# Patient Record
Sex: Female | Born: 1970 | Race: Black or African American | Hispanic: No | Marital: Single | Smoking: Current every day smoker
Health system: Southern US, Community
[De-identification: ages and names within clinical notes are randomized; demographics above are authoritative.]

## PROBLEM LIST (undated history)

## (undated) ENCOUNTER — Ambulatory Visit (HOSPITAL_COMMUNITY): Disposition: A | Discharge status: Home / Self Care | Payer: Self-pay

## (undated) ENCOUNTER — Emergency Department (HOSPITAL_COMMUNITY): Payer: Self-pay

## (undated) DIAGNOSIS — M7989 Other specified soft tissue disorders: Secondary | ICD-10-CM

## (undated) DIAGNOSIS — I270 Primary pulmonary hypertension: Secondary | ICD-10-CM

## (undated) DIAGNOSIS — G4730 Sleep apnea, unspecified: Secondary | ICD-10-CM

## (undated) DIAGNOSIS — J40 Bronchitis, not specified as acute or chronic: Secondary | ICD-10-CM

## (undated) DIAGNOSIS — J309 Allergic rhinitis, unspecified: Secondary | ICD-10-CM

## (undated) DIAGNOSIS — D649 Anemia, unspecified: Secondary | ICD-10-CM

## (undated) DIAGNOSIS — N63 Unspecified lump in unspecified breast: Secondary | ICD-10-CM

## (undated) DIAGNOSIS — L309 Dermatitis, unspecified: Secondary | ICD-10-CM

## (undated) DIAGNOSIS — E611 Iron deficiency: Secondary | ICD-10-CM

## (undated) DIAGNOSIS — R011 Cardiac murmur, unspecified: Secondary | ICD-10-CM

## (undated) DIAGNOSIS — F1010 Alcohol abuse, uncomplicated: Secondary | ICD-10-CM

## (undated) DIAGNOSIS — I10 Essential (primary) hypertension: Secondary | ICD-10-CM

## (undated) DIAGNOSIS — I509 Heart failure, unspecified: Secondary | ICD-10-CM

## (undated) DIAGNOSIS — E559 Vitamin D deficiency, unspecified: Secondary | ICD-10-CM

## (undated) DIAGNOSIS — E785 Hyperlipidemia, unspecified: Secondary | ICD-10-CM

## (undated) DIAGNOSIS — J45909 Unspecified asthma, uncomplicated: Secondary | ICD-10-CM

## (undated) HISTORY — DX: Iron deficiency: E61.1

## (undated) HISTORY — DX: Anemia, unspecified: D64.9

## (undated) HISTORY — DX: Heart failure, unspecified: I50.9

## (undated) HISTORY — DX: Vitamin D deficiency, unspecified: E55.9

## (undated) HISTORY — DX: Unspecified lump in unspecified breast: N63.0

## (undated) HISTORY — DX: Other specified soft tissue disorders: M79.89

## (undated) HISTORY — DX: Hyperlipidemia, unspecified: E78.5

## (undated) HISTORY — DX: Dermatitis, unspecified: L30.9

## (undated) HISTORY — DX: Unspecified asthma, uncomplicated: J45.909

## (undated) HISTORY — DX: Sleep apnea, unspecified: G47.30

## (undated) HISTORY — DX: Alcohol abuse, uncomplicated: F10.10

## (undated) HISTORY — DX: Cardiac murmur, unspecified: R01.1

## (undated) HISTORY — DX: Bronchitis, not specified as acute or chronic: J40

## (undated) HISTORY — DX: Primary pulmonary hypertension: I27.0

## (undated) HISTORY — DX: Allergic rhinitis, unspecified: J30.9

---

## 2007-12-12 ENCOUNTER — Emergency Department (HOSPITAL_COMMUNITY): Admission: EM | Admit: 2007-12-12 | Discharge: 2007-12-12 | Payer: Self-pay | Admitting: Emergency Medicine

## 2009-10-19 IMAGING — CR DG FOOT COMPLETE 3+V*R*
3 series · 3 of 3 positions shown · non-contrast
Comparison: None

CLINICAL DATA: Twisting injury to the right foot with lateral pain.

RIGHT FOOT COMPLETE - 3+ VIEW

[view not recorded (1 of 3)]
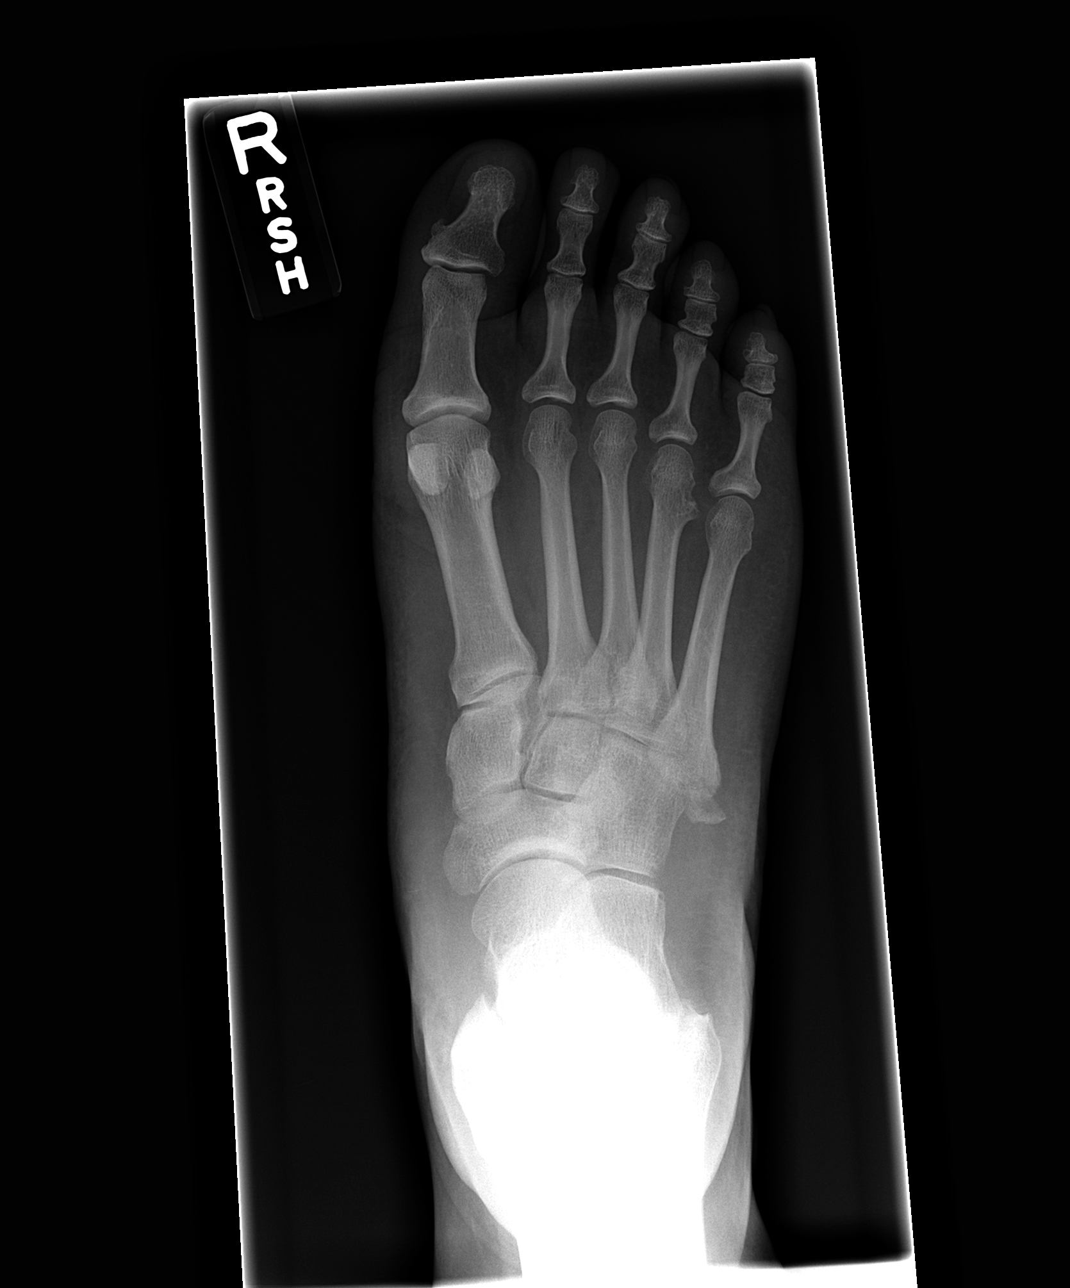

[view not recorded (2 of 3)]
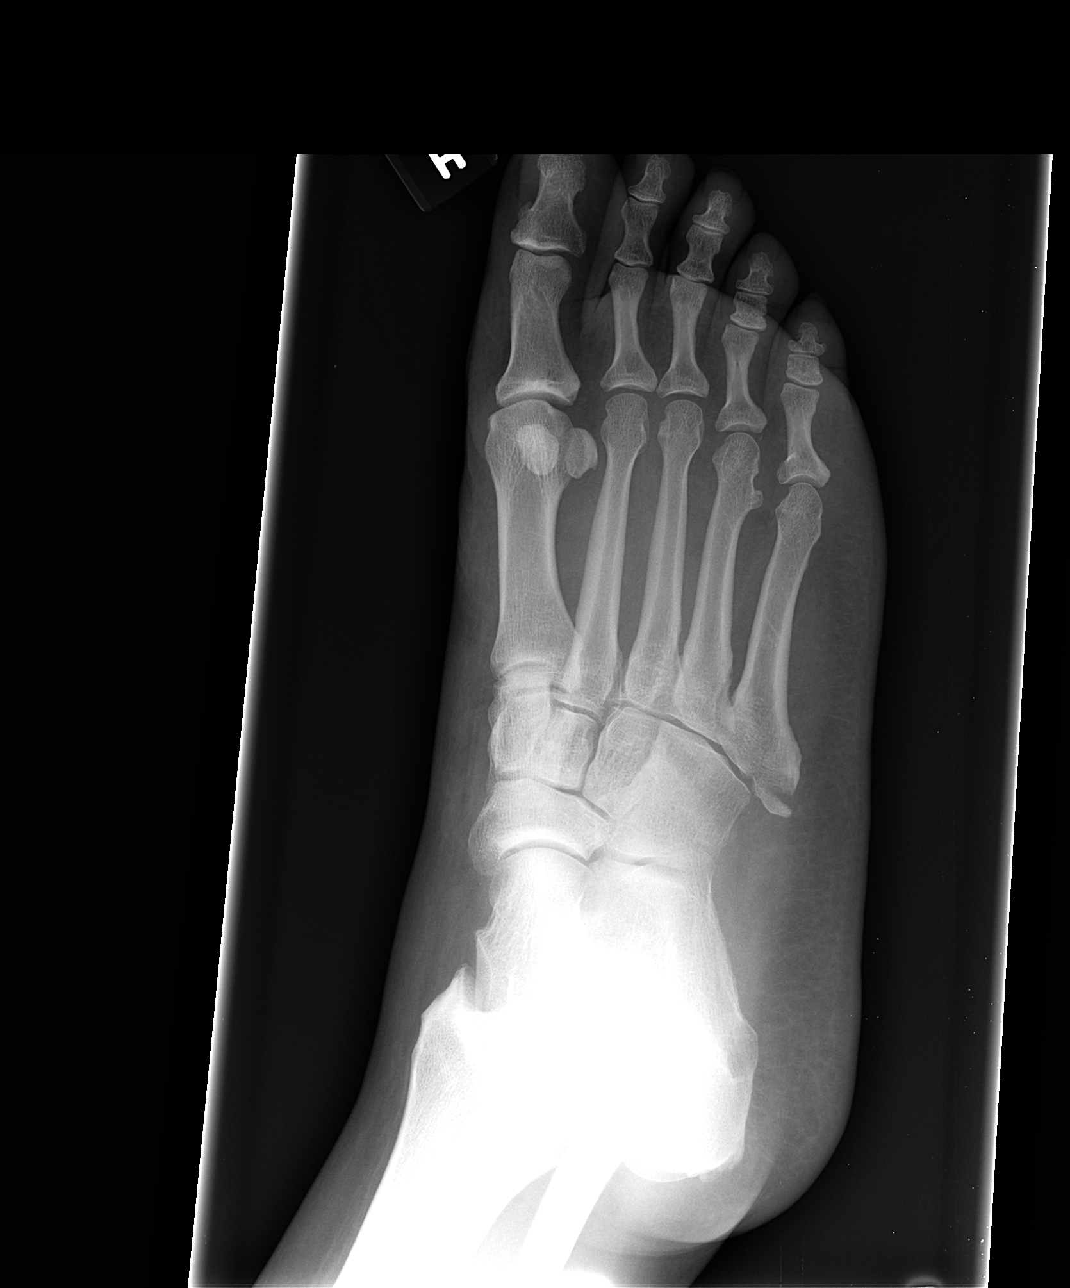

[view not recorded (3 of 3)]
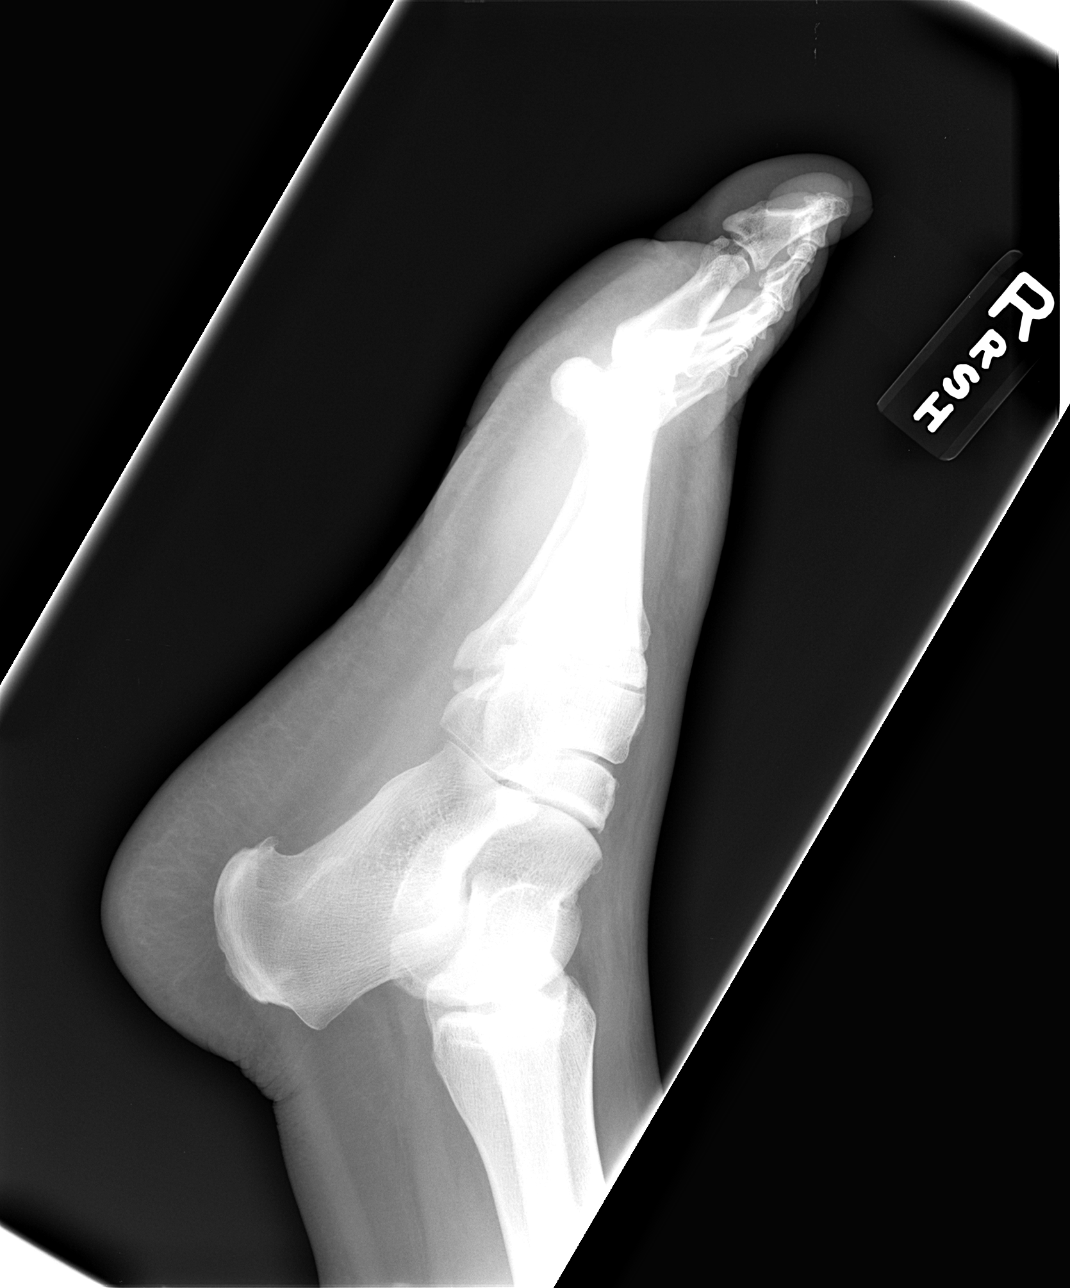

[3 of 3 positions shown; findings below may reference images not displayed]

FINDINGS: A mildly displaced transverse avulsion fracture of the
base of the fifth metatarsal is present.  This does not appear to
extend into the intermetatarsal region.

Plantar and Achilles calcaneal spurs are present.  There is mild
soft tissue swelling along the dorsum of the foot overlying the
metatarsals.

No definite malalignment at the Lisfranc joint is noted.  There is
a small osteochondroma of the distal metaphysis of the fourth
metatarsal.
IMPRESSION: 1.  Mildly displaced transverse avulsion fracture of the base of
the fifth metatarsal, without extension into the intermetatarsal
articulation.
2.  Plantar and Achilles calcaneal spurs.
3.  Osteochondroma of the distal fourth metatarsal.

## 2013-04-28 ENCOUNTER — Other Ambulatory Visit: Payer: Self-pay

## 2013-04-28 DIAGNOSIS — Z1231 Encounter for screening mammogram for malignant neoplasm of breast: Secondary | ICD-10-CM

## 2013-05-22 ENCOUNTER — Ambulatory Visit
Admission: RE | Admit: 2013-05-22 | Discharge: 2013-05-22 | Disposition: A | Discharge status: Home / Self Care | Payer: BC Managed Care – PPO | Source: Ambulatory Visit

## 2013-05-22 DIAGNOSIS — Z1231 Encounter for screening mammogram for malignant neoplasm of breast: Secondary | ICD-10-CM

## 2013-05-24 ENCOUNTER — Other Ambulatory Visit: Payer: Self-pay | Admitting: *Deleted

## 2013-05-24 DIAGNOSIS — R928 Other abnormal and inconclusive findings on diagnostic imaging of breast: Secondary | ICD-10-CM

## 2013-07-04 ENCOUNTER — Other Ambulatory Visit: Payer: BC Managed Care – PPO

## 2013-07-18 ENCOUNTER — Other Ambulatory Visit: Payer: BC Managed Care – PPO

## 2013-07-25 ENCOUNTER — Ambulatory Visit
Admission: RE | Admit: 2013-07-25 | Discharge: 2013-07-25 | Disposition: A | Discharge status: Home / Self Care | Payer: BC Managed Care – PPO | Source: Ambulatory Visit | Attending: *Deleted | Admitting: *Deleted

## 2013-07-25 DIAGNOSIS — R928 Other abnormal and inconclusive findings on diagnostic imaging of breast: Secondary | ICD-10-CM

## 2016-08-10 ENCOUNTER — Other Ambulatory Visit: Payer: Self-pay | Admitting: Physician Assistant

## 2016-08-10 DIAGNOSIS — Z1231 Encounter for screening mammogram for malignant neoplasm of breast: Secondary | ICD-10-CM

## 2016-08-18 ENCOUNTER — Other Ambulatory Visit: Payer: Self-pay | Admitting: Physician Assistant

## 2016-08-18 DIAGNOSIS — N63 Unspecified lump in unspecified breast: Secondary | ICD-10-CM

## 2018-11-23 DIAGNOSIS — I639 Cerebral infarction, unspecified: Secondary | ICD-10-CM

## 2018-11-23 HISTORY — DX: Cerebral infarction, unspecified: I63.9

## 2021-05-02 ENCOUNTER — Emergency Department (HOSPITAL_COMMUNITY)
Admission: EM | Admit: 2021-05-02 | Discharge: 2021-05-03 | Disposition: A | Discharge status: Home / Self Care | Payer: Self-pay | Attending: Emergency Medicine | Admitting: Emergency Medicine

## 2021-05-02 ENCOUNTER — Other Ambulatory Visit: Payer: Self-pay

## 2021-05-02 DIAGNOSIS — R0989 Other specified symptoms and signs involving the circulatory and respiratory systems: Secondary | ICD-10-CM

## 2021-05-02 DIAGNOSIS — F17210 Nicotine dependence, cigarettes, uncomplicated: Secondary | ICD-10-CM | POA: Insufficient documentation

## 2021-05-02 DIAGNOSIS — I82401 Acute embolism and thrombosis of unspecified deep veins of right lower extremity: Secondary | ICD-10-CM | POA: Insufficient documentation

## 2021-05-02 DIAGNOSIS — I10 Essential (primary) hypertension: Secondary | ICD-10-CM | POA: Insufficient documentation

## 2021-05-02 DIAGNOSIS — R791 Abnormal coagulation profile: Secondary | ICD-10-CM | POA: Insufficient documentation

## 2021-05-02 DIAGNOSIS — E876 Hypokalemia: Secondary | ICD-10-CM | POA: Insufficient documentation

## 2021-05-02 DIAGNOSIS — R609 Edema, unspecified: Secondary | ICD-10-CM

## 2021-05-02 HISTORY — DX: Essential (primary) hypertension: I10

## 2021-05-02 LAB — CBC WITH DIFFERENTIAL/PLATELET
Abs Immature Granulocytes: 0.06 10*3/uL (ref 0.00–0.07)
Basophils Absolute: 0.1 10*3/uL (ref 0.0–0.1)
Basophils Relative: 1 %
Eosinophils Absolute: 0.3 10*3/uL (ref 0.0–0.5)
Eosinophils Relative: 3 %
HCT: 35.5 % — ABNORMAL LOW (ref 36.0–46.0)
Hemoglobin: 12.7 g/dL (ref 12.0–15.0)
Immature Granulocytes: 1 %
Lymphocytes Relative: 19 %
Lymphs Abs: 1.7 10*3/uL (ref 0.7–4.0)
MCH: 30.5 pg (ref 26.0–34.0)
MCHC: 35.8 g/dL (ref 30.0–36.0)
MCV: 85.1 fL (ref 80.0–100.0)
Monocytes Absolute: 0.7 10*3/uL (ref 0.1–1.0)
Monocytes Relative: 8 %
Neutro Abs: 6.2 10*3/uL (ref 1.7–7.7)
Neutrophils Relative %: 68 %
Platelets: 331 10*3/uL (ref 150–400)
RBC: 4.17 MIL/uL (ref 3.87–5.11)
RDW: 14.6 % (ref 11.5–15.5)
WBC: 9 10*3/uL (ref 4.0–10.5)
nRBC: 0 % (ref 0.0–0.2)

## 2021-05-02 LAB — COMPREHENSIVE METABOLIC PANEL
ALT: 23 U/L (ref 0–44)
AST: 22 U/L (ref 15–41)
Albumin: 3.9 g/dL (ref 3.5–5.0)
Alkaline Phosphatase: 67 U/L (ref 38–126)
Anion gap: 14 (ref 5–15)
BUN: 15 mg/dL (ref 6–20)
CO2: 27 mmol/L (ref 22–32)
Calcium: 9.6 mg/dL (ref 8.9–10.3)
Chloride: 87 mmol/L — ABNORMAL LOW (ref 98–111)
Creatinine, Ser: 0.71 mg/dL (ref 0.44–1.00)
GFR, Estimated: >60 mL/min (ref >60)
Glucose, Bld: 91 mg/dL (ref 70–99)
Potassium: 3.1 mmol/L — ABNORMAL LOW (ref 3.5–5.1)
Sodium: 128 mmol/L — ABNORMAL LOW (ref 135–145)
Total Bilirubin: 0.7 mg/dL (ref 0.3–1.2)
Total Protein: 8.5 g/dL — ABNORMAL HIGH (ref 6.5–8.1)

## 2021-05-02 NOTE — ED Triage Notes (Signed)
Pt came in with c/o R leg swelling. Pt states that it started hurting a couple of days ago but noticed swelling yesterday. 3+ pitting edema. Pt has hx of stroke with R sided weakness.

## 2021-05-03 ENCOUNTER — Encounter (HOSPITAL_COMMUNITY): Payer: Self-pay

## 2021-05-03 ENCOUNTER — Ambulatory Visit (HOSPITAL_COMMUNITY)
Admission: RE | Admit: 2021-05-03 | Discharge: 2021-05-03 | Disposition: A | Discharge status: Home / Self Care | Payer: Self-pay | Source: Ambulatory Visit | Attending: Emergency Medicine | Admitting: Emergency Medicine

## 2021-05-03 DIAGNOSIS — R609 Edema, unspecified: Secondary | ICD-10-CM | POA: Insufficient documentation

## 2021-05-03 LAB — D-DIMER, QUANTITATIVE: D-Dimer, Quant: 0.86 ug/mL-FEU — ABNORMAL HIGH (ref 0.00–0.50)

## 2021-05-03 MED ORDER — POTASSIUM CHLORIDE CRYS ER 20 MEQ PO TBCR
40.0000 meq | EXTENDED_RELEASE_TABLET | Freq: Once | ORAL | Status: AC
  Administered 2021-05-03: 40 meq via ORAL
  Filled 2021-05-03: qty 2

## 2021-05-03 MED ORDER — ENOXAPARIN SODIUM 80 MG/0.8ML IJ SOSY
1.0000 mg/kg | PREFILLED_SYRINGE | Freq: Once | INTRAMUSCULAR | Status: AC
  Administered 2021-05-03: 80 mg via SUBCUTANEOUS
  Filled 2021-05-03: qty 0.8

## 2021-05-03 NOTE — ED Provider Notes (Signed)
[facility]-EMERGENCY DEPT Provider Note: Maria Dell, MD, FACEP  CSN: [id number] MRN: [id number] ARRIVAL: 05/02/21 at 2038 ROOM: WA18/WA18   CHIEF COMPLAINT  Leg Swelling   HISTORY OF PRESENT ILLNESS  05/03/21 12:41 AM Maria Galvan is a 50 y.o. female with swelling of the right lower extremity for the past 3 days.  There is associated pain of that leg which she rates as a 6 out of 10, worse with weightbearing (she somewhat drags that leg when she walks due to an old stroke).  It is not red or warm.  She has no associated fever, chest pain or shortness of breath.    Past Medical History:  Diagnosis Date   Hypertension    Stroke [facility]) 11/23/2018    History reviewed. No pertinent surgical history.  History reviewed. No pertinent family history.  Social History   Tobacco Use   Smoking status: Every Day    Packs/day: 1.00    Years: 25.00    Pack years: 25.00    Types: Cigarettes  Substance Use Topics   Alcohol use: Yes    Alcohol/week: 42.0 standard drinks    Types: 42 Cans of beer per week    Comment: 6 cans of beer a day   Drug use: Not Currently    Prior to Admission medications   Not on File    Allergies Lisinopril   REVIEW OF SYSTEMS  Negative except as noted here or in the History of Present Illness.   PHYSICAL EXAMINATION  Initial Vital Signs Blood pressure (!) 140/98, pulse 73, temperature 98 F (36.7 C), temperature source Oral, resp. rate 17, height 5\' 4"  (1.626 m), weight 79.4 kg, SpO2 95 %.  Examination General: Well-developed, well-nourished female in no acute distress; appearance consistent with age of record HENT: normocephalic; atraumatic Eyes: pupils equal, round and reactive to light; extraocular muscles intact Neck: supple Heart: regular rate and rhythm Lungs: clear to auscultation bilaterally Abdomen: soft; nondistended; nontender; bowel sounds present Extremities: No deformity; 2+ pitting edema of right lower extremity without warmth  or erythema Neurologic: Awake, alert and oriented; mild right hemiparesis; no facial droop Skin: Warm and dry Psychiatric: Normal mood and affect   RESULTS  Summary of this visit's results, reviewed and interpreted by myself:   EKG Interpretation  Date/Time:    Ventricular Rate:    PR Interval:    QRS Duration:   QT Interval:    QTC Calculation:   R Axis:     Text Interpretation:         Laboratory Studies: Results for orders placed or performed during the hospital encounter of 05/02/21 (from the past 24 hour(s))  CBC with Differential     Status: Abnormal   Collection Time: 05/02/21  9:04 PM  Result Value Ref Range   WBC 9.0 4.0 - 10.5 K/uL   RBC 4.17 3.87 - 5.11 MIL/uL   Hemoglobin 12.7 12.0 - 15.0 g/dL   HCT 07/02/21 (L) 41.6 - 60.6 %   MCV 85.1 80.0 - 100.0 fL   MCH 30.5 26.0 - 34.0 pg   MCHC 35.8 30.0 - 36.0 g/dL   RDW 30.1 60.1 - 09.3 %   Platelets 331 150 - 400 K/uL   nRBC 0.0 0.0 - 0.2 %   Neutrophils Relative % 68 %   Neutro Abs 6.2 1.7 - 7.7 K/uL   Lymphocytes Relative 19 %   Lymphs Abs 1.7 0.7 - 4.0 K/uL   Monocytes Relative 8 %   Monocytes  Absolute 0.7 0.1 - 1.0 K/uL   Eosinophils Relative 3 %   Eosinophils Absolute 0.3 0.0 - 0.5 K/uL   Basophils Relative 1 %   Basophils Absolute 0.1 0.0 - 0.1 K/uL   Immature Granulocytes 1 %   Abs Immature Granulocytes 0.06 0.00 - 0.07 K/uL  Comprehensive metabolic panel     Status: Abnormal   Collection Time: 05/02/21  9:04 PM  Result Value Ref Range   Sodium 128 (L) 135 - 145 mmol/L   Potassium 3.1 (L) 3.5 - 5.1 mmol/L   Chloride 87 (L) 98 - 111 mmol/L   CO2 27 22 - 32 mmol/L   Glucose, Bld 91 70 - 99 mg/dL   BUN 15 6 - 20 mg/dL   Creatinine, Ser 1.06 0.44 - 1.00 mg/dL   Calcium 9.6 8.9 - 26.9 mg/dL   Total Protein 8.5 (H) 6.5 - 8.1 g/dL   Albumin 3.9 3.5 - 5.0 g/dL   AST 22 15 - 41 U/L   ALT 23 0 - 44 U/L   Alkaline Phosphatase 67 38 - 126 U/L   Total Bilirubin 0.7 0.3 - 1.2 mg/dL   GFR, Estimated >48 >54  mL/min   Anion gap 14 5 - 15  D-dimer, quantitative     Status: Abnormal   Collection Time: 05/03/21 12:49 AM  Result Value Ref Range   D-Dimer, Quant 0.86 (H) 0.00 - 0.50 ug/mL-FEU   Imaging Studies: No results found.  ED COURSE and MDM  Nursing notes, initial and subsequent vitals signs, including pulse oximetry, reviewed and interpreted by myself.  Vitals:   05/02/21 2052 05/03/21 0048 05/03/21 0130  BP: (!) 140/98 (!) 124/91 (!) 141/92  Pulse: 73 66 66  Resp: 17 18 17   Temp: 98 F (36.7 C)    TempSrc: Oral    SpO2: 95% 99% 96%  Weight: 79.4 kg    Height: 5\' 4"  (1.626 m)     Medications  enoxaparin (LOVENOX) injection 80 mg (has no administration in time range)  potassium chloride SA (KLOR-CON) CR tablet 40 mEq (40 mEq Oral Given 05/03/21 0051)   1:41 AM Patient's D-dimer is elevated.  We will give her a shot of Lovenox and have her return to the vascular lab at [facility] for a Doppler study later today.   PROCEDURES  Procedures   ED DIAGNOSES     ICD-10-CM   1. Suspected DVT (deep vein thrombosis)  R09.89     2. Hypokalemia  E87.6          Jermaine Neuharth, [facility], MD 05/03/21 [phone]

## 2021-05-03 NOTE — Discharge Instructions (Addendum)
Be at the [facility] vascular lab at 8 AM today for an ultrasound study of your right leg.

## 2021-05-03 NOTE — Progress Notes (Signed)
VASCULAR LAB    Right lower extremity venous duplex has been performed.  See CV proc for preliminary results.   Adam Sanjuan, RVT 05/03/2021, 11:53 AM

## 2022-06-06 DIAGNOSIS — I10 Essential (primary) hypertension: Secondary | ICD-10-CM | POA: Diagnosis not present

## 2022-06-06 DIAGNOSIS — J309 Allergic rhinitis, unspecified: Secondary | ICD-10-CM | POA: Diagnosis not present

## 2022-06-06 DIAGNOSIS — J209 Acute bronchitis, unspecified: Secondary | ICD-10-CM | POA: Diagnosis not present

## 2022-08-18 DIAGNOSIS — J45909 Unspecified asthma, uncomplicated: Secondary | ICD-10-CM | POA: Diagnosis not present

## 2022-08-18 DIAGNOSIS — I10 Essential (primary) hypertension: Secondary | ICD-10-CM | POA: Diagnosis not present

## 2022-09-18 DIAGNOSIS — I10 Essential (primary) hypertension: Secondary | ICD-10-CM | POA: Diagnosis not present

## 2022-09-18 DIAGNOSIS — E785 Hyperlipidemia, unspecified: Secondary | ICD-10-CM | POA: Diagnosis not present

## 2022-09-18 DIAGNOSIS — G4730 Sleep apnea, unspecified: Secondary | ICD-10-CM | POA: Diagnosis not present

## 2022-09-18 DIAGNOSIS — J45909 Unspecified asthma, uncomplicated: Secondary | ICD-10-CM | POA: Diagnosis not present

## 2023-11-22 ENCOUNTER — Ambulatory Visit: Payer: Medicare PPO | Attending: Cardiology | Admitting: Cardiology

## 2023-11-22 NOTE — Progress Notes (Deleted)
  Cardiology Office Note:  .   Date:  11/22/2023  ID:  Maria Galvan, DOB 01/14/1971, MRN [id number] PCP: Darden Dates, FNP  [facility] Providers Cardiologist:  Truett Mainland, MD PCP: Darden Dates, FNP  No chief complaint on file.     History of Present Illness: .    Maria Galvan is a 53 y.o. female with ***  Discussed the use of AI scribe software for clinical note transcription with the patient, who gave verbal consent to proceed.  History of Present Illness      There were no vitals filed for this visit.   ROS: *** ROS      Studies Reviewed: .        *** Independently interpreted ***/202***: Chol ***, TG ***, HDL ***, LDL *** HbA1C ***% Hb *** Cr *** ***  Risk Assessment/Calculations:   {Does this patient have ATRIAL FIBRILLATION?[phone]}    Physical Exam:   Physical Exam   VISIT DIAGNOSES: No diagnosis found.   ASSESSMENT AND PLAN: .    Maria Galvan is a 53 y.o. female with ***  {Are you ordering a CV Procedure (e.g. stress test, cath, DCCV, TEE, etc)?   Press F2        :[phone]}   Assessment and Plan Assessment & Plan       No orders of the defined types were placed in this encounter.    F/u in ***  Signed, Elder Negus, MD

## 2023-11-30 ENCOUNTER — Telehealth: Payer: Self-pay

## 2023-11-30 NOTE — Telephone Encounter (Signed)
-----   Message from NA Gavin Pound M sent at 11/22/2023 11:17 AM EDT ----- Regarding: APPOINTMENT This patient was late for her appointment, can someone please reach out to her. (11/22/23 appoint) Thanks

## 2023-11-30 NOTE — Telephone Encounter (Signed)
 LVM 3x to r/s NP appt

## 2023-12-06 ENCOUNTER — Ambulatory Visit: Attending: Internal Medicine | Admitting: Internal Medicine

## 2023-12-06 ENCOUNTER — Encounter: Payer: Self-pay | Admitting: Internal Medicine

## 2023-12-06 VITALS — BP 136/94 | HR 67 | Ht 64.0 in | Wt 202.8 lb

## 2023-12-06 DIAGNOSIS — I4891 Unspecified atrial fibrillation: Secondary | ICD-10-CM

## 2023-12-06 DIAGNOSIS — Z136 Encounter for screening for cardiovascular disorders: Secondary | ICD-10-CM | POA: Diagnosis not present

## 2023-12-06 MED ORDER — APIXABAN 5 MG PO TABS: 5.0000 mg | ORAL_TABLET | Freq: Two times a day (BID) | ORAL | 3 refills | Status: AC

## 2023-12-06 NOTE — Patient Instructions (Signed)
 Medication Instructions:  TAKE Furosemide (Lasix) 40 mg daily for 5 days START Eliquis 5 mg twice daily  *If you need a refill on your cardiac medications before your next appointment, please call your pharmacy*  Lab Work: TSH and BNP today BMET in one week   If you have labs (blood work) drawn today and your tests are completely normal, you will receive your results only by: MyChart Message (if you have MyChart) OR A paper copy in the mail If you have any lab test that is abnormal or we need to change your treatment, we will call you to review the results.  Testing/Procedures: Your physician has requested that you have an echocardiogram. Echocardiography is a painless test that uses sound waves to create images of your heart. It provides your doctor with information about the size and shape of your heart and how well your heart's chambers and valves are working. This procedure takes approximately one hour. There are no restrictions for this procedure. Please do NOT wear cologne, perfume, aftershave, or lotions (deodorant is allowed). Please arrive 15 minutes prior to your appointment time.   Follow-Up: At [facility], you and your health needs are our priority.  As part of our continuing mission to provide you with exceptional heart care, our providers are all part of one team.  This team includes your primary Cardiologist (physician) and Advanced Practice Providers or APPs (Physician Assistants and Nurse Practitioners) who all work together to provide you with the care you need, when you need it.  Your next appointment:   1 week(s)  Provider:   Any APP  We recommend signing up for the patient portal called "MyChart".  Sign up information is provided on this After Visit Summary.  MyChart is used to connect with patients for Virtual Visits (Telemedicine).  Patients are able to view lab/test results, encounter notes, upcoming appointments, etc.  Non-urgent messages can be sent  to your provider as well.   To learn more about what you can do with MyChart, go to [email or web address].   Other Instructions If SOB and swelling worsens, please go to ER to be evaluated    1st Floor: - Lobby - Registration  - Pharmacy  - Lab - Cafe  2nd Floor: - PV Lab - Diagnostic Testing (echo, CT, nuclear med)  3rd Floor: - Vacant  4th Floor: - TCTS (cardiothoracic surgery) - AFib Clinic - Structural Heart Clinic - Vascular Surgery  - Vascular Ultrasound  5th Floor: - HeartCare Cardiology (general and EP) - Clinical Pharmacy for coumadin, hypertension, lipid, weight-loss medications, and med management appointments    Valet parking services will be available as well.

## 2023-12-06 NOTE — Progress Notes (Signed)
 Cardiology Office Note:  .   Date:  12/06/2023  ID:  Maria Galvan, DOB 06/30/1971, MRN [id number] PCP: Darden Dates, FNP  [facility] Providers Cardiologist:  None    History of Present Illness: .   Maria Galvan is a 53 y.o. female with hx of HTN and CVA, who is being referred for c/f heart failure from [facility]. Her legs have been swelling since January. She was started on lasix 20 mg daily  She has no labs.  She is in atrial fibrillation today. She said years ago, she may have had a mild heart attack. She had a stroke in 2020. She takes aspirin and statin. She sleeps in a recliner chair. She has chest pain in her central chest, only when she moves her arm or reaches and bends down. SOB is chronic and started 2 years ago. She was started on inhalers in the past which has helped. She describes orthopnea. She still smokes cigarettes  Mother had atrial fibrillation.  ROS:  per HPI otherwise negative   Studies Reviewed: Marland Kitchen   EKG Interpretation Date/Time:  Monday December 06 2023 15:15:06 EDT Ventricular Rate:  67 PR Interval:    QRS Duration:  88 QT Interval:  438 QTC Calculation: 462 R Axis:   150  Text Interpretation: Atrial fibrillation Right axis deviation Possible Anterior infarct , age undetermined No previous ECGs available Confirmed by Carolan Clines [phone]) on 12/06/2023 3:16:04 PM    EKG Interpretation Date/Time:  Monday December 06 2023 15:15:06 EDT Ventricular Rate:  67 PR Interval:    QRS Duration:  88 QT Interval:  438 QTC Calculation: 462 R Axis:   150  Text Interpretation: Atrial fibrillation Right axis deviation Possible Anterior infarct , age undetermined No previous ECGs available Confirmed by Carolan Clines ([id number]) on 12/06/2023 3:16:04 PM  Risk Assessment/Calculations:    CHA2DS2-VASc Score = 5   This indicates a 7.2% annual risk of stroke. The patient's score is based upon: CHF History: 1 HTN History: 1 Diabetes History: 0 Stroke History:  2 Vascular Disease History: 0 Age Score: 0 Gender Score: 1         Physical Exam:   VS:  BP (!) 136/94 (BP Location: Left Arm, Patient Position: Sitting, Cuff Size: Normal)   Pulse 67   Ht 5\' 4"  (1.626 m)   Wt 202 lb 12.8 oz (92 kg)   SpO2 96%   BMI 34.81 kg/m    Wt Readings from Last 3 Encounters:  12/06/23 202 lb 12.8 oz (92 kg)  05/02/21 175 lb (79.4 kg)    GEN: Well nourished, well developed in no acute distress. coughing NECK: + JVD CARDIAC: IRRR, no murmurs, rubs, gallops RESPIRATORY:  nl wob, no wheezing, no rales ABDOMEN: distended EXTREMITIES:  significant LE edema  ASSESSMENT AND PLAN: .   Signs of CHF BNP today Increase to lasix 40 mg BID for 5 days BMET in 1 week and APP  Paroxysmal Atrial Fibrillation New Onset, identified today Rate controlled, asymptomatic. Will diurese first and start anticoagulation. If she remains in afib after 3 weeks, then can plan DCCV. - start eliquis 5 mg BID -TSH - TTE  HTN Mild elevation today Continue to monitor -continue norvasc 10 mg daily, coreg 25 mg BID, olmesartan 40 mg daily  Discussed with her that if SOB gets worse or if oral medications do not work, she will need to go to the hospital for IV diuresis.    Dispo: Follow ujp  in 1 week with an APP  Signed, Makiah Clauson, Alben Spittle, MD

## 2023-12-07 ENCOUNTER — Telehealth: Payer: Self-pay

## 2023-12-07 ENCOUNTER — Other Ambulatory Visit: Payer: Self-pay

## 2023-12-07 DIAGNOSIS — Z136 Encounter for screening for cardiovascular disorders: Secondary | ICD-10-CM

## 2023-12-07 LAB — BRAIN NATRIURETIC PEPTIDE: BNP: 161.1 pg/mL — ABNORMAL HIGH (ref 0.0–100.0)

## 2023-12-07 LAB — TSH: TSH: 5.08 u[IU]/mL — ABNORMAL HIGH (ref 0.450–4.500)

## 2023-12-07 NOTE — Addendum Note (Signed)
 Addended by: Benay Spice on: 12/07/2023 10:48 AM   Modules accepted: Orders

## 2023-12-07 NOTE — Telephone Encounter (Signed)
 Spoke to pt . Verified name and DOB. Relayed following message: Please let Mrs. Heber know that her TSH was borderline elevated.  Please have her come back for a free and total T3 and T4.  Patient verbalized understanding and lab orders placed.   Josie LPN

## 2023-12-07 NOTE — Telephone Encounter (Signed)
-----   Message from Carolan Clines E sent at 12/07/2023  9:27 AM EDT ----- Please let Maria Galvan know that her TSH was borderline elevated.  Please have her come back for a free and total T3 and T4.

## 2023-12-08 ENCOUNTER — Telehealth: Payer: Self-pay

## 2023-12-08 NOTE — Telephone Encounter (Signed)
 Called patient. Relayed following message: Please let her know that her BNP was only mildly increased.  She can continue the plan with her Lasix and follow-up with an APP. Patient verbalized understanding.  Josie LPN

## 2023-12-14 LAB — T3: T3, Total: 60 ng/dL — ABNORMAL LOW (ref 71–180)

## 2023-12-14 LAB — BASIC METABOLIC PANEL WITH GFR
BUN/Creatinine Ratio: 17 (ref 9–23)
BUN: 15 mg/dL (ref 6–24)
CO2: 27 mmol/L (ref 20–29)
Calcium: 9.6 mg/dL (ref 8.7–10.2)
Chloride: 90 mmol/L — ABNORMAL LOW (ref 96–106)
Creatinine, Ser: 0.9 mg/dL (ref 0.57–1.00)
Glucose: 81 mg/dL (ref 70–99)
Potassium: 3.2 mmol/L — ABNORMAL LOW (ref 3.5–5.2)
Sodium: 134 mmol/L (ref 134–144)
eGFR: 76 mL/min/{1.73_m2} (ref >59)

## 2023-12-14 LAB — T4: T4, Total: 5.4 ug/dL (ref 4.5–12.0)

## 2024-01-14 ENCOUNTER — Other Ambulatory Visit (HOSPITAL_COMMUNITY)

## 2024-01-18 ENCOUNTER — Ambulatory Visit (HOSPITAL_COMMUNITY)
Admission: RE | Admit: 2024-01-18 | Discharge: 2024-01-18 | Disposition: A | Discharge status: Home / Self Care | Source: Ambulatory Visit | Attending: Cardiology | Admitting: Cardiology

## 2024-01-18 DIAGNOSIS — I4891 Unspecified atrial fibrillation: Secondary | ICD-10-CM | POA: Insufficient documentation

## 2024-01-18 DIAGNOSIS — I083 Combined rheumatic disorders of mitral, aortic and tricuspid valves: Secondary | ICD-10-CM

## 2024-01-18 DIAGNOSIS — Z136 Encounter for screening for cardiovascular disorders: Secondary | ICD-10-CM | POA: Insufficient documentation

## 2024-01-18 DIAGNOSIS — I517 Cardiomegaly: Secondary | ICD-10-CM | POA: Diagnosis not present

## 2024-01-18 LAB — ECHOCARDIOGRAM COMPLETE: S' Lateral: 2.7 cm

## 2024-02-02 ENCOUNTER — Ambulatory Visit: Payer: Self-pay | Admitting: *Deleted

## 2024-07-07 ENCOUNTER — Other Ambulatory Visit: Payer: Self-pay

## 2024-07-07 DIAGNOSIS — Z1231 Encounter for screening mammogram for malignant neoplasm of breast: Secondary | ICD-10-CM

## 2024-07-07 DIAGNOSIS — R222 Localized swelling, mass and lump, trunk: Secondary | ICD-10-CM

## 2024-07-14 ENCOUNTER — Ambulatory Visit
Admission: RE | Admit: 2024-07-14 | Discharge: 2024-07-14 | Disposition: A | Discharge status: Home / Self Care | Source: Ambulatory Visit

## 2024-07-14 DIAGNOSIS — R222 Localized swelling, mass and lump, trunk: Secondary | ICD-10-CM

## 2024-08-04 ENCOUNTER — Ambulatory Visit

## 2024-08-11 ENCOUNTER — Ambulatory Visit
